# Patient Record
Sex: Male | Born: 2000 | Hispanic: No | Marital: Single
Health system: Southern US, Community
[De-identification: ages and names within clinical notes are randomized; demographics above are authoritative.]

## PROBLEM LIST (undated history)

## (undated) HISTORY — PX: APPENDECTOMY: SHX54

---
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
## 2020-01-11 DIAGNOSIS — Z419 Encounter for procedure for purposes other than remedying health state, unspecified: Secondary | ICD-10-CM | POA: Diagnosis not present

## 2020-02-11 DIAGNOSIS — Z419 Encounter for procedure for purposes other than remedying health state, unspecified: Secondary | ICD-10-CM | POA: Diagnosis not present

## 2020-03-12 DIAGNOSIS — Z419 Encounter for procedure for purposes other than remedying health state, unspecified: Secondary | ICD-10-CM | POA: Diagnosis not present

## 2020-04-12 DIAGNOSIS — Z419 Encounter for procedure for purposes other than remedying health state, unspecified: Secondary | ICD-10-CM | POA: Diagnosis not present

## 2020-05-12 DIAGNOSIS — Z419 Encounter for procedure for purposes other than remedying health state, unspecified: Secondary | ICD-10-CM | POA: Diagnosis not present

## 2020-06-12 DIAGNOSIS — Z419 Encounter for procedure for purposes other than remedying health state, unspecified: Secondary | ICD-10-CM | POA: Diagnosis not present

## 2020-07-13 DIAGNOSIS — Z419 Encounter for procedure for purposes other than remedying health state, unspecified: Secondary | ICD-10-CM | POA: Diagnosis not present

## 2020-08-10 DIAGNOSIS — Z419 Encounter for procedure for purposes other than remedying health state, unspecified: Secondary | ICD-10-CM | POA: Diagnosis not present

## 2020-09-10 DIAGNOSIS — Z419 Encounter for procedure for purposes other than remedying health state, unspecified: Secondary | ICD-10-CM | POA: Diagnosis not present

## 2020-10-10 DIAGNOSIS — Z419 Encounter for procedure for purposes other than remedying health state, unspecified: Secondary | ICD-10-CM | POA: Diagnosis not present

## 2020-11-10 DIAGNOSIS — Z419 Encounter for procedure for purposes other than remedying health state, unspecified: Secondary | ICD-10-CM | POA: Diagnosis not present

## 2020-12-10 DIAGNOSIS — Z419 Encounter for procedure for purposes other than remedying health state, unspecified: Secondary | ICD-10-CM | POA: Diagnosis not present

## 2021-01-10 DIAGNOSIS — Z419 Encounter for procedure for purposes other than remedying health state, unspecified: Secondary | ICD-10-CM | POA: Diagnosis not present

## 2021-02-10 DIAGNOSIS — Z419 Encounter for procedure for purposes other than remedying health state, unspecified: Secondary | ICD-10-CM | POA: Diagnosis not present

## 2021-03-12 DIAGNOSIS — Z419 Encounter for procedure for purposes other than remedying health state, unspecified: Secondary | ICD-10-CM | POA: Diagnosis not present

## 2021-04-12 DIAGNOSIS — Z419 Encounter for procedure for purposes other than remedying health state, unspecified: Secondary | ICD-10-CM | POA: Diagnosis not present

## 2021-05-12 DIAGNOSIS — Z419 Encounter for procedure for purposes other than remedying health state, unspecified: Secondary | ICD-10-CM | POA: Diagnosis not present

## 2021-06-12 DIAGNOSIS — Z419 Encounter for procedure for purposes other than remedying health state, unspecified: Secondary | ICD-10-CM | POA: Diagnosis not present

## 2021-07-13 DIAGNOSIS — Z419 Encounter for procedure for purposes other than remedying health state, unspecified: Secondary | ICD-10-CM | POA: Diagnosis not present

## 2021-08-10 DIAGNOSIS — Z419 Encounter for procedure for purposes other than remedying health state, unspecified: Secondary | ICD-10-CM | POA: Diagnosis not present

## 2021-09-10 DIAGNOSIS — Z419 Encounter for procedure for purposes other than remedying health state, unspecified: Secondary | ICD-10-CM | POA: Diagnosis not present

## 2021-10-10 DIAGNOSIS — Z419 Encounter for procedure for purposes other than remedying health state, unspecified: Secondary | ICD-10-CM | POA: Diagnosis not present

## 2021-11-10 DIAGNOSIS — Z419 Encounter for procedure for purposes other than remedying health state, unspecified: Secondary | ICD-10-CM | POA: Diagnosis not present

## 2021-12-10 DIAGNOSIS — Z419 Encounter for procedure for purposes other than remedying health state, unspecified: Secondary | ICD-10-CM | POA: Diagnosis not present
